# Patient Record
Sex: Female | Born: 1950 | Race: White | Hispanic: No | Marital: Married | Smoking: Never smoker
Health system: Southern US, Community
[De-identification: ages and names within clinical notes are randomized; demographics above are authoritative.]

## PROBLEM LIST (undated history)

## (undated) DIAGNOSIS — N200 Calculus of kidney: Secondary | ICD-10-CM

## (undated) DIAGNOSIS — D0510 Intraductal carcinoma in situ of unspecified breast: Secondary | ICD-10-CM

## (undated) DIAGNOSIS — C801 Malignant (primary) neoplasm, unspecified: Secondary | ICD-10-CM

## (undated) DIAGNOSIS — M810 Age-related osteoporosis without current pathological fracture: Secondary | ICD-10-CM

## (undated) DIAGNOSIS — N950 Postmenopausal bleeding: Secondary | ICD-10-CM

## (undated) DIAGNOSIS — E611 Iron deficiency: Secondary | ICD-10-CM

## (undated) HISTORY — DX: Malignant (primary) neoplasm, unspecified: C80.1

## (undated) HISTORY — DX: Postmenopausal bleeding: N95.0

## (undated) HISTORY — DX: Age-related osteoporosis without current pathological fracture: M81.0

## (undated) HISTORY — DX: Iron deficiency: E61.1

## (undated) HISTORY — DX: Calculus of kidney: N20.0

## (undated) HISTORY — PX: TONSILLECTOMY AND ADENOIDECTOMY: SHX28

## (undated) HISTORY — DX: Intraductal carcinoma in situ of unspecified breast: D05.10

## (undated) HISTORY — PX: BREAST LUMPECTOMY: SHX2

---

## 1985-09-02 DIAGNOSIS — N200 Calculus of kidney: Secondary | ICD-10-CM

## 1985-09-02 HISTORY — DX: Calculus of kidney: N20.0

## 2001-03-20 ENCOUNTER — Encounter: Payer: Self-pay | Admitting: Emergency Medicine

## 2001-03-20 ENCOUNTER — Encounter: Admission: RE | Admit: 2001-03-20 | Discharge: 2001-03-20 | Payer: Self-pay | Admitting: Emergency Medicine

## 2001-04-06 ENCOUNTER — Encounter: Admission: RE | Admit: 2001-04-06 | Discharge: 2001-04-06 | Payer: Self-pay | Admitting: Emergency Medicine

## 2001-04-06 ENCOUNTER — Encounter: Payer: Self-pay | Admitting: Emergency Medicine

## 2002-09-02 DIAGNOSIS — D0510 Intraductal carcinoma in situ of unspecified breast: Secondary | ICD-10-CM

## 2002-09-02 HISTORY — PX: BREAST SURGERY: SHX581

## 2002-09-02 HISTORY — DX: Intraductal carcinoma in situ of unspecified breast: D05.10

## 2002-10-14 ENCOUNTER — Encounter (INDEPENDENT_AMBULATORY_CARE_PROVIDER_SITE_OTHER): Payer: Self-pay | Admitting: *Deleted

## 2002-10-14 ENCOUNTER — Encounter: Admission: RE | Admit: 2002-10-14 | Discharge: 2002-10-14 | Payer: Self-pay | Admitting: General Surgery

## 2002-10-14 ENCOUNTER — Encounter: Payer: Self-pay | Admitting: General Surgery

## 2002-10-21 ENCOUNTER — Ambulatory Visit (HOSPITAL_COMMUNITY): Admission: RE | Admit: 2002-10-21 | Discharge: 2002-10-21 | Payer: Self-pay | Admitting: General Surgery

## 2002-10-21 ENCOUNTER — Encounter: Payer: Self-pay | Admitting: General Surgery

## 2002-10-22 ENCOUNTER — Encounter: Payer: Self-pay | Admitting: General Surgery

## 2002-10-22 ENCOUNTER — Ambulatory Visit (HOSPITAL_COMMUNITY): Admission: RE | Admit: 2002-10-22 | Discharge: 2002-10-22 | Payer: Self-pay | Admitting: General Surgery

## 2002-10-28 ENCOUNTER — Encounter: Payer: Self-pay | Admitting: General Surgery

## 2002-10-28 ENCOUNTER — Ambulatory Visit (HOSPITAL_BASED_OUTPATIENT_CLINIC_OR_DEPARTMENT_OTHER): Admission: RE | Admit: 2002-10-28 | Discharge: 2002-10-28 | Payer: Self-pay | Admitting: General Surgery

## 2002-10-28 ENCOUNTER — Encounter: Admission: RE | Admit: 2002-10-28 | Discharge: 2002-10-28 | Payer: Self-pay | Admitting: General Surgery

## 2002-10-28 ENCOUNTER — Encounter (INDEPENDENT_AMBULATORY_CARE_PROVIDER_SITE_OTHER): Payer: Self-pay | Admitting: Specialist

## 2002-11-22 ENCOUNTER — Ambulatory Visit: Admission: RE | Admit: 2002-11-22 | Discharge: 2003-01-26 | Payer: Self-pay | Admitting: *Deleted

## 2003-04-18 ENCOUNTER — Encounter: Payer: Self-pay | Admitting: General Surgery

## 2003-04-18 ENCOUNTER — Encounter: Admission: RE | Admit: 2003-04-18 | Discharge: 2003-04-18 | Payer: Self-pay | Admitting: General Surgery

## 2003-08-05 ENCOUNTER — Ambulatory Visit (HOSPITAL_COMMUNITY): Admission: RE | Admit: 2003-08-05 | Discharge: 2003-08-05 | Payer: Self-pay | Admitting: Emergency Medicine

## 2003-10-05 ENCOUNTER — Encounter: Admission: RE | Admit: 2003-10-05 | Discharge: 2003-10-05 | Payer: Self-pay | Admitting: General Surgery

## 2004-06-28 ENCOUNTER — Other Ambulatory Visit: Admission: RE | Admit: 2004-06-28 | Discharge: 2004-06-28 | Payer: Self-pay | Admitting: Obstetrics and Gynecology

## 2004-10-05 ENCOUNTER — Encounter: Admission: RE | Admit: 2004-10-05 | Discharge: 2004-10-05 | Payer: Self-pay | Admitting: Obstetrics and Gynecology

## 2005-04-03 ENCOUNTER — Ambulatory Visit: Payer: Self-pay | Admitting: Oncology

## 2005-08-13 ENCOUNTER — Other Ambulatory Visit: Admission: RE | Admit: 2005-08-13 | Discharge: 2005-08-13 | Payer: Self-pay | Admitting: Obstetrics and Gynecology

## 2005-10-07 ENCOUNTER — Encounter: Admission: RE | Admit: 2005-10-07 | Discharge: 2005-10-07 | Payer: Self-pay | Admitting: Obstetrics and Gynecology

## 2005-11-07 ENCOUNTER — Encounter: Admission: RE | Admit: 2005-11-07 | Discharge: 2005-11-07 | Payer: Self-pay | Admitting: General Surgery

## 2006-04-08 ENCOUNTER — Ambulatory Visit: Payer: Self-pay | Admitting: Oncology

## 2006-04-10 LAB — COMPREHENSIVE METABOLIC PANEL
ALT: 24 U/L (ref 0–40)
AST: 20 U/L (ref 0–37)
CO2: 27 mEq/L (ref 19–32)
Creatinine, Ser: 0.92 mg/dL (ref 0.40–1.20)
Sodium: 142 mEq/L (ref 135–145)
Total Bilirubin: 0.4 mg/dL (ref 0.3–1.2)
Total Protein: 6.4 g/dL (ref 6.0–8.3)

## 2006-04-10 LAB — CBC WITH DIFFERENTIAL/PLATELET
BASO%: 0.7 % (ref 0.0–2.0)
EOS%: 2.1 % (ref 0.0–7.0)
HCT: 34.6 % — ABNORMAL LOW (ref 34.8–46.6)
LYMPH%: 35.2 % (ref 14.0–48.0)
MCH: 32 pg (ref 26.0–34.0)
MCHC: 34.1 g/dL (ref 32.0–36.0)
MONO#: 0.4 10*3/uL (ref 0.1–0.9)
NEUT%: 52.5 % (ref 39.6–76.8)
Platelets: 286 10*3/uL (ref 145–400)
RBC: 3.68 10*6/uL — ABNORMAL LOW (ref 3.70–5.32)
WBC: 4.3 10*3/uL (ref 3.9–10.0)
lymph#: 1.5 10*3/uL (ref 0.9–3.3)

## 2006-09-02 HISTORY — PX: HYSTEROSCOPY W/D&C: SHX1775

## 2006-10-08 ENCOUNTER — Encounter: Admission: RE | Admit: 2006-10-08 | Discharge: 2006-10-08 | Payer: Self-pay | Admitting: General Surgery

## 2006-10-20 ENCOUNTER — Encounter: Admission: RE | Admit: 2006-10-20 | Discharge: 2006-10-20 | Payer: Self-pay | Admitting: Emergency Medicine

## 2006-11-28 ENCOUNTER — Encounter: Admission: RE | Admit: 2006-11-28 | Discharge: 2006-11-28 | Payer: Self-pay | Admitting: Emergency Medicine

## 2007-02-24 ENCOUNTER — Encounter: Admission: RE | Admit: 2007-02-24 | Discharge: 2007-02-24 | Payer: Self-pay | Admitting: Family Medicine

## 2007-02-24 ENCOUNTER — Other Ambulatory Visit: Admission: RE | Admit: 2007-02-24 | Discharge: 2007-02-24 | Payer: Self-pay | Admitting: Family Medicine

## 2007-02-24 ENCOUNTER — Encounter (INDEPENDENT_AMBULATORY_CARE_PROVIDER_SITE_OTHER): Payer: Self-pay | Admitting: Family Medicine

## 2007-04-14 ENCOUNTER — Ambulatory Visit: Payer: Self-pay | Admitting: Oncology

## 2007-04-17 LAB — COMPREHENSIVE METABOLIC PANEL
ALT: 15 U/L (ref 0–35)
Alkaline Phosphatase: 68 U/L (ref 39–117)
CO2: 25 mEq/L (ref 19–32)
Creatinine, Ser: 0.82 mg/dL (ref 0.40–1.20)
Sodium: 140 mEq/L (ref 135–145)
Total Bilirubin: 0.4 mg/dL (ref 0.3–1.2)

## 2007-04-17 LAB — CBC WITH DIFFERENTIAL/PLATELET
BASO%: 0.5 % (ref 0.0–2.0)
EOS%: 1.8 % (ref 0.0–7.0)
HCT: 34.4 % — ABNORMAL LOW (ref 34.8–46.6)
LYMPH%: 31.6 % (ref 14.0–48.0)
MCH: 32.2 pg (ref 26.0–34.0)
MCHC: 35 g/dL (ref 32.0–36.0)
MCV: 92.1 fL (ref 81.0–101.0)
MONO%: 8 % (ref 0.0–13.0)
NEUT%: 58.1 % (ref 39.6–76.8)
Platelets: 298 10*3/uL (ref 145–400)
RBC: 3.73 10*6/uL (ref 3.70–5.32)

## 2007-05-14 ENCOUNTER — Ambulatory Visit (HOSPITAL_COMMUNITY): Admission: RE | Admit: 2007-05-14 | Discharge: 2007-05-14 | Payer: Self-pay | Admitting: Obstetrics and Gynecology

## 2007-05-14 ENCOUNTER — Encounter (INDEPENDENT_AMBULATORY_CARE_PROVIDER_SITE_OTHER): Payer: Self-pay | Admitting: Obstetrics and Gynecology

## 2007-09-03 HISTORY — PX: OTHER SURGICAL HISTORY: SHX169

## 2007-10-07 ENCOUNTER — Ambulatory Visit: Payer: Self-pay | Admitting: Oncology

## 2007-10-12 LAB — CBC WITH DIFFERENTIAL/PLATELET
EOS%: 3.1 % (ref 0.0–7.0)
HCT: 35.4 % (ref 34.8–46.6)
HGB: 12 g/dL (ref 11.6–15.9)
LYMPH%: 37.7 % (ref 14.0–48.0)
MCHC: 33.9 g/dL (ref 32.0–36.0)
MCV: 92 fL (ref 81.0–101.0)
MONO#: 0.4 10*3/uL (ref 0.1–0.9)
MONO%: 9.3 % (ref 0.0–13.0)
NEUT%: 49.6 % (ref 39.6–76.8)
Platelets: 253 10*3/uL (ref 145–400)
RBC: 3.85 10*6/uL (ref 3.70–5.32)
RDW: 12.3 % (ref 11.3–14.5)
WBC: 4.3 10*3/uL (ref 3.9–10.0)

## 2007-10-12 LAB — LACTATE DEHYDROGENASE: LDH: 154 U/L (ref 94–250)

## 2007-10-12 LAB — COMPREHENSIVE METABOLIC PANEL
ALT: 16 U/L (ref 0–35)
AST: 20 U/L (ref 0–37)
Albumin: 4.3 g/dL (ref 3.5–5.2)
Sodium: 138 mEq/L (ref 135–145)
Total Protein: 6.7 g/dL (ref 6.0–8.3)

## 2007-10-12 LAB — FSH/LH: FSH: 14.5 m[IU]/mL

## 2007-10-14 ENCOUNTER — Encounter: Admission: RE | Admit: 2007-10-14 | Discharge: 2007-10-14 | Payer: Self-pay | Admitting: Oncology

## 2007-10-16 ENCOUNTER — Encounter: Admission: RE | Admit: 2007-10-16 | Discharge: 2007-10-16 | Payer: Self-pay | Admitting: Oncology

## 2008-10-21 ENCOUNTER — Encounter: Admission: RE | Admit: 2008-10-21 | Discharge: 2008-10-21 | Payer: Self-pay | Admitting: Obstetrics and Gynecology

## 2008-12-06 ENCOUNTER — Ambulatory Visit: Payer: Self-pay | Admitting: Oncology

## 2008-12-08 LAB — CBC WITH DIFFERENTIAL/PLATELET
BASO%: 0.8 % (ref 0.0–2.0)
EOS%: 2.7 % (ref 0.0–7.0)
Eosinophils Absolute: 0.1 10*3/uL (ref 0.0–0.5)
HGB: 12.1 g/dL (ref 11.6–15.9)
LYMPH%: 35.4 % (ref 14.0–49.7)
MONO%: 10.7 % (ref 0.0–14.0)
NEUT#: 2.2 10*3/uL (ref 1.5–6.5)
NEUT%: 50.4 % (ref 38.4–76.8)
RBC: 3.76 10*6/uL (ref 3.70–5.45)
RDW: 13.3 % (ref 11.2–14.5)
WBC: 4.3 10*3/uL (ref 3.9–10.3)

## 2008-12-08 LAB — COMPREHENSIVE METABOLIC PANEL
CO2: 29 mEq/L (ref 19–32)
Chloride: 106 mEq/L (ref 96–112)
Potassium: 3.5 mEq/L (ref 3.5–5.3)

## 2009-10-30 ENCOUNTER — Encounter: Admission: RE | Admit: 2009-10-30 | Discharge: 2009-10-30 | Payer: Self-pay | Admitting: Obstetrics and Gynecology

## 2009-11-09 ENCOUNTER — Encounter: Admission: RE | Admit: 2009-11-09 | Discharge: 2009-11-09 | Payer: Self-pay | Admitting: General Surgery

## 2010-04-18 ENCOUNTER — Encounter: Admission: RE | Admit: 2010-04-18 | Discharge: 2010-04-18 | Payer: Self-pay | Admitting: Geriatric Medicine

## 2010-09-23 ENCOUNTER — Encounter: Payer: Self-pay | Admitting: Family Medicine

## 2010-11-01 ENCOUNTER — Other Ambulatory Visit: Payer: Self-pay | Admitting: Obstetrics and Gynecology

## 2010-11-01 DIAGNOSIS — Z9889 Other specified postprocedural states: Secondary | ICD-10-CM

## 2010-11-19 ENCOUNTER — Ambulatory Visit
Admission: RE | Admit: 2010-11-19 | Discharge: 2010-11-19 | Disposition: A | Discharge status: Home / Self Care | Payer: BC Managed Care – PPO | Source: Ambulatory Visit | Attending: Obstetrics and Gynecology | Admitting: Obstetrics and Gynecology

## 2010-11-19 DIAGNOSIS — Z9889 Other specified postprocedural states: Secondary | ICD-10-CM

## 2011-01-15 NOTE — Op Note (Signed)
Felicia Malone, ORMISTON             ACCOUNT NO.:  [id number]   MEDICAL RECORD NO.:  [id number]          PATIENT TYPE:  AMB   LOCATION:  SDC                           FACILITY:  [facility]   PHYSICIAN:  Hal Morales, M.D.DATE OF BIRTH:  [birth year]   DATE OF PROCEDURE:  DATE OF DISCHARGE:                               OPERATIVE REPORT   PREOPERATIVE DIAGNOSES:  1. Postmenopausal bleeding.  2. Question of endometrial mass on sonohysterogram.   POSTOPERATIVE DIAGNOSIS:  Postmenopausal bleeding.   PROCEDURE:  Hysteroscopy, dilatation and curettage.   SURGEON:  Hal Morales, M.D.   ANESTHESIA:  General orotracheal.   ESTIMATED BLOOD LOSS:  Less than 10 mL.   COMPLICATIONS:  None.   FINDINGS:  The endometrial cavity was normally-shaped with shaggy areas  of endometrium, but for the most part the endometrium was atrophic.  There were no specific lesions noted at the time of hysteroscopy.  A  small amount of curettings was obtained at the time of curettage.   PROCEDURE:  The patient was taken to the operating room after  appropriate identification and placed on the operating table.  After the  attainment of adequate general anesthesia, she was placed in low  lithotomy position.  The perineum and vagina were prepped with multiple  layers of Betadine and draped as a sterile field.  A Foley catheter was  used to empty the bladder.  A Graves speculum was placed in the vagina  and a single-tooth tenaculum placed on the anterior cervix.  The uterus  was sounded to 8 cm.  The cervix was dilated to accommodate the  diagnostic hysteroscope and that hysteroscope used to note and document  the above-noted findings.  The hysteroscope was removed and curettage  undertaken.  The hysteroscope was reinserted showing that most of the  shaggy endometrium had been removed and again that there were no  endometrial lesions.  All instruments were then removed from the vagina  and the patient awakened  from general anesthesia and taken to the  recovery room in satisfactory condition, having tolerated the procedure  well with sponge and instrument counts correct.   SPECIMENS TO PATHOLOGY:  Endometrial curettings.   DISCHARGE INSTRUCTIONS:  Printed instructions for D&C from the [facility].   DISCHARGE MEDICATIONS:  Ibuprofen 600 mg p.o. q.6h. for 24 hours, then  p.r.n..  The patient will continue her preadmission medications, which  include tamoxifen 20 mg a day and Fosamax 70 mg weekly.      Hal Morales, M.D.  Electronically Signed     VPH/MEDQ  D:  05/14/2007  T:  05/14/2007  Job:  [id number]

## 2011-01-15 NOTE — H&P (Signed)
NAMEAUSTEN, Felicia Malone             ACCOUNT NO.:  [id number]   MEDICAL RECORD NO.:  [id number]          PATIENT TYPE:  AMB   LOCATION:  SDC                           FACILITY:  [facility]   PHYSICIAN:  Hal Morales, M.D.DATE OF BIRTH:  1951-02-05   DATE OF ADMISSION:  05/14/2007  DATE OF DISCHARGE:                              HISTORY & PHYSICAL   HISTORY OF PRESENT ILLNESS:  Patient is a 60 year old married white  female, para 0-0-1-0, who presents for evaluation of postmenopausal  bleeding.  The patient had her last menstrual period in July, 2007 until  she had an episode of vaginal bleeding on April 14, 2007 lasting  approximately three days with light-to-moderate flow and no cramping.  She has had no previous bleeding over the previous year.  She denied any  abdominal pain.   Workup of her postmenopausal bleeding included an ultrasound and  sonohystogram with the ultrasound showing a posterior lower uterine  segment subserosal fibroid measuring 2.7 x 2.8 cm.  The right and left  ovaries were normal.  The endometrial thickness was 0.34 cm.  At the  time of sonohystogram, there appeared to be a mass within the  endometrial cavity measuring 0.84 x 0.68 cm.  The uterus was noted to be  retroflexed.  Based on this finding, the recommendation has been made  for hysteroscopy D&C and resection of a mass.   PAST MEDICAL HISTORY:  1. GYN history:  The patient underwent menarche at age 34 and had      menses every 6-8 weeks until 2007.  She had not previously had the      diagnosis of fibroids made.  2. Obstetrical history:  Patient had one pregnancy termination many      years ago without complication.  3. 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  She      started tamoxifen in May, 2005 and continues that through today.      She also has a history of a kidney stones in 1987 and a history of  anemia in the past.  4. Surgical history:  Tonsillectomy and lumpectomy.   FAMILY HISTORY:  Positive for heart disease with a brother who had a  myocardial infarction at age 51 and a father who died in his 44s of  heart disease.  She also has a family history of hypertension.   CURRENT MEDICATIONS:  Tamoxifen, calcium with vitamin D.   HABITS:  Patient is married and is now retired.  She does not smoke  cigarettes.  She drinks 3-5 glasses of wine per week.  She exercises  five times per week.   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  She has had some improvement in her pain,  but  this started to improve prior to starting the Fosamax.   PHYSICAL EXAMINATION:  The patient is a well-developed white female in  no acute distress.  Temperature 97.5, blood pressure 100/70, weight 136 pounds.  LUNGS:  Clear.  HEART:  Regular rate and rhythm.  ABDOMEN:  Soft without masses or organomegaly.  PELVIC:  EG/BUS within normal limits.  Vagina is atrophic.  Cervix is  atrophic with endocervical contact bleeding.  The uterus is normal in  size, shape, consistency, anterior, mobile, and nontender.  Adnexa:  No  masses.   IMPRESSION:  A single episode of postmenopausal bleeding in a patient on  tamoxifen therapy with suggestion of an endometrial mass measuring less  than 1 cm.   DISPOSITION:  A discussion is held with the patient concerning the  recommendation for hysteroscopy/D&C.  She seems to understand, has had  her questions answered, has discussed the risks and benefits with me,  which include but are not limited to anesthesia, bleeding, infection,  damage to adjacent organs, and the risk of uterine perforation.  She  wishes  to proceed with hysteroscopy, D&C and resection of endometrial  mass at [facility] on May 14, 2007.      Hal Morales, M.D.  Electronically Signed     VPH/MEDQ  D:  05/13/2007  T:  05/13/2007  Job:  [id number]

## 2011-01-18 NOTE — Op Note (Signed)
   NAME:  Felicia Malone, Felicia Malone                       ACCOUNT NO.:  [id number]   MEDICAL RECORD NO.:  [id number]                   PATIENT TYPE:  AMB   LOCATION:  DSC                                  FACILITY:  [facility]   PHYSICIAN:  Rose Phi. Maple Hudson, M.D.                DATE OF BIRTH:  [birth year]   DATE OF PROCEDURE:  10/28/2002  DATE OF DISCHARGE:                                 OPERATIVE REPORT   PREOPERATIVE DIAGNOSIS:  Ductal carcinoma in situ, left breast.   POSTOPERATIVE DIAGNOSIS:  Ductal carcinoma in situ, left breast.   PROCEDURE:  Left partial mastectomy with needle localization and specimen  mammography.   SURGEON:  Rose Phi. Maple Hudson, M.D.   ANESTHESIA:  General.   DESCRIPTION OF PROCEDURE:  After suitable general anesthesia was induced,  the patient was placed in the supine position with the left breast prepped  and draped in the usual fashion.  The wire entry point was at the 3 o'clock  position of the left breast and a curved incision using the wire as a guide  was made, and then a wide excision of the wire and surrounding tissue was  carried out.  In the more medial portion of the 9 o'clock, I thought I was a  little close to the wire so a second specimen was excised from there and  also submitted as part of the specimen.  Then there was a little nodular  tissue at the 6 o'clock position and I excised that, which will go also as a  separate specimen, so the larger specimen is the wire and needle  localization, followed by a second specimen of tissue coming from the 3  o'clock position and then a third specimen from the 6 o'clock position.  Specimen mammography confirmed the removal of the area.  Hemostasis obtained  with the cautery.  Subcuticular closure of 4-0 Monocryl and Steri-Strips  carried out.  Dressing applied.  The patient transferred to the recovery  room in satisfactory condition, having tolerated the procedure well.      Rose Phi. Maple Hudson, M.D.    PRY/MEDQ  D:  10/28/2002  T:  10/28/2002  Job:  [id number]

## 2011-02-21 ENCOUNTER — Other Ambulatory Visit: Payer: Self-pay | Admitting: Dermatology

## 2011-06-14 LAB — CBC
HCT: 35.1 — ABNORMAL LOW
MCHC: 34.8
MCV: 93.4
Platelets: 237
WBC: 5.3

## 2011-10-29 ENCOUNTER — Other Ambulatory Visit (INDEPENDENT_AMBULATORY_CARE_PROVIDER_SITE_OTHER): Payer: Self-pay | Admitting: General Surgery

## 2011-10-29 DIAGNOSIS — Z853 Personal history of malignant neoplasm of breast: Secondary | ICD-10-CM

## 2011-11-28 ENCOUNTER — Ambulatory Visit (INDEPENDENT_AMBULATORY_CARE_PROVIDER_SITE_OTHER): Payer: Self-pay | Admitting: General Surgery

## 2011-11-28 ENCOUNTER — Ambulatory Visit
Admission: RE | Admit: 2011-11-28 | Discharge: 2011-11-28 | Disposition: A | Discharge status: Home / Self Care | Payer: BC Managed Care – PPO | Source: Ambulatory Visit | Attending: General Surgery | Admitting: General Surgery

## 2011-11-28 DIAGNOSIS — Z853 Personal history of malignant neoplasm of breast: Secondary | ICD-10-CM

## 2011-12-10 ENCOUNTER — Telehealth: Payer: Self-pay | Admitting: Obstetrics and Gynecology

## 2011-12-11 NOTE — Telephone Encounter (Signed)
PC TO PT PER MESSAGE,"QUESTIONS RGDG MEDS PRESCRIBED". LM ON VM FOR PT TO CB.

## 2011-12-11 NOTE — Telephone Encounter (Signed)
PC FROM PT. PT WANTS TO KNOW IF VPH AGREED WITH RECS OF PROLIA PER RECS FROM [place]. INFORMED SPINE SERIES TO R/O FRACTURES IS REC PER VPH(SEE 08/16/11 OV) AND WILL THEN FU. PT AGREES TO HAVE XRAYS DONE. WILL FU WITH PT AFTER RESULTS REVIEWED BY VPH.

## 2011-12-16 ENCOUNTER — Ambulatory Visit
Admission: RE | Admit: 2011-12-16 | Discharge: 2011-12-16 | Disposition: A | Discharge status: Home / Self Care | Payer: BC Managed Care – PPO | Source: Ambulatory Visit | Attending: Obstetrics and Gynecology | Admitting: Obstetrics and Gynecology

## 2011-12-25 ENCOUNTER — Telehealth: Payer: Self-pay | Admitting: Obstetrics and Gynecology

## 2011-12-25 NOTE — Telephone Encounter (Signed)
TC TO PT PER TELEPHONE CALL.  TOLD PT THORACIC AND LUMBAR SPINE XRAY RECEIVED AND WILL HAVE VPH REVIEW. WILL CB WITH RECS. PT AGREES.

## 2011-12-25 NOTE — Telephone Encounter (Signed)
PC TO PT. TOLD PT THORACIC SPINE XRAY-WNL. LUMBAR SPINE XRAY-INDETERMINATE COMPRESSION FRACTURE. REC FOR PT TO START MEDS. PT STATES," PREV DISCUSSED WITH DR. PAUL(ORTHO) TO START PROLIA". PT AGREES.

## 2012-01-06 ENCOUNTER — Encounter (INDEPENDENT_AMBULATORY_CARE_PROVIDER_SITE_OTHER): Payer: Self-pay | Admitting: General Surgery

## 2012-01-08 ENCOUNTER — Ambulatory Visit (INDEPENDENT_AMBULATORY_CARE_PROVIDER_SITE_OTHER): Payer: BC Managed Care – PPO | Admitting: General Surgery

## 2012-01-08 ENCOUNTER — Encounter (INDEPENDENT_AMBULATORY_CARE_PROVIDER_SITE_OTHER): Payer: Self-pay | Admitting: General Surgery

## 2012-01-08 VITALS — BP 120/81 | HR 80 | Temp 98.6°F | Resp 14 | Ht 62.0 in | Wt 136.6 lb

## 2012-01-08 DIAGNOSIS — D0590 Unspecified type of carcinoma in situ of unspecified breast: Secondary | ICD-10-CM

## 2012-01-08 DIAGNOSIS — D0512 Intraductal carcinoma in situ of left breast: Secondary | ICD-10-CM

## 2012-01-08 NOTE — Progress Notes (Signed)
Subjective:     Patient ID: Felicia Malone, female   DOB: July 18, 1951, 61 y.o.   MRN: [id number]  HPI The patient is a 61 year old white female who is 9 years out from a left breast lumpectomy for DCIS. She finished radiation therapy and 5 years of tamoxifen. Since her last visit her only complaint is of some arthritic foot pain. She denies any breast pain. She denies any discharge from her nipple. Her last mammogram was in March and showed no evidence of malignancy.  Review of Systems  Constitutional: Negative.   HENT: Negative.   Eyes: Negative.   Respiratory: Negative.   Cardiovascular: Negative.   Gastrointestinal: Negative.   Genitourinary: Negative.   Musculoskeletal: Positive for arthralgias.  Skin: Negative.   Neurological: Negative.   Hematological: Negative.   Psychiatric/Behavioral: Negative.        Objective:   Physical Exam  Constitutional: She is oriented to person, place, and time. She appears well-developed and well-nourished.  HENT:  Head: Normocephalic and atraumatic.  Eyes: Conjunctivae and EOM are normal. Pupils are equal, round, and reactive to light.  Neck: Normal range of motion. Neck supple.  Cardiovascular: Normal rate, regular rhythm and normal heart sounds.   Pulmonary/Chest: Effort normal and breath sounds normal.       She has some mild fullness beneath the scar in the lateral left breast secondary to radiation. Otherwise there is no palpable mass in either breast. No palpable axillary supraclavicular or cervical lymphadenopathy  Abdominal: Soft. Bowel sounds are normal. She exhibits no mass. There is no tenderness.  Musculoskeletal: Normal range of motion.  Lymphadenopathy:    She has no cervical adenopathy.  Neurological: She is alert and oriented to person, place, and time.  Skin: Skin is warm and dry.  Psychiatric: She has a normal mood and affect. Her behavior is normal.       Assessment:     9 years status post left breast lumpectomy for  DCIS    Plan:     At this point I have encouraged her to continue to do regular self exams. We will plan to see her back in one year.

## 2012-01-08 NOTE — Patient Instructions (Signed)
Continue regular self exams  

## 2012-07-01 ENCOUNTER — Encounter: Payer: Self-pay | Admitting: Obstetrics and Gynecology

## 2012-07-01 ENCOUNTER — Ambulatory Visit (INDEPENDENT_AMBULATORY_CARE_PROVIDER_SITE_OTHER): Payer: BC Managed Care – PPO | Admitting: Obstetrics and Gynecology

## 2012-07-01 VITALS — BP 100/76 | Ht 62.0 in | Wt 136.0 lb

## 2012-07-01 DIAGNOSIS — M810 Age-related osteoporosis without current pathological fracture: Secondary | ICD-10-CM

## 2012-07-01 DIAGNOSIS — N200 Calculus of kidney: Secondary | ICD-10-CM | POA: Insufficient documentation

## 2012-07-01 DIAGNOSIS — D0510 Intraductal carcinoma in situ of unspecified breast: Secondary | ICD-10-CM | POA: Insufficient documentation

## 2012-07-01 NOTE — Progress Notes (Signed)
Annual:  Last Pap: 03/2010 due 2014 per notes  WNL: Yes Regular Periods:no Contraception: VAS  Monthly Breast exam:yes Tetanus<40yrs:yes Nl.Bladder Function:yes "pt states sometimes she does not feel like she empties her bladder" Daily BMs:yes Healthy Diet:yes Calcium:yes Mammogram:yes Date of Mammogram: 11/19/2011 "WNL" Exercise:yes Have often Exercise: Light Daily  Seatbelt: yes Abuse at home: no Stressful work:no "Not working" Sigmoid-colonoscopy: 2009 "WNL" per Dr. Homero Fellers due 2019 Bone Density: Yes 08/2011 Dr.Paul PCP: Wilhemina Cash, MD Change in PMH: No Changes Change in FMH:No Changes  Subjective:    Felicia Malone is a 61 y.o. female No obstetric history on file. who presents for annual exam.  Vaginal dryness painful intercourse continue.  Has only used Replens and otc lubricants.  The following portions of the patient's history were reviewed and updated as appropriate: allergies, current medications, past family history, past medical history, past social history, past surgical history and problem list.  Review of Systems Pertinent items are noted in HPI. Gastrointestinal:No change in bowel habits, no abdominal pain, no rectal bleeding Genitourinary:negative for dysuria, frequency, hematuria, nocturia and urinary incontinence    Objective:     BP 100/76  Ht 5\' 2"  (1.575 m)  Wt 136 lb (61.689 kg)  BMI 24.87 kg/m2  Weight:  Wt Readings from Last 1 Encounters:  07/01/12 136 lb (61.689 kg)     BMI: Body mass index is 24.87 kg/(m^2). General Appearance: Alert, appropriate appearance for age. No acute distress HEENT: Grossly normal Neck / Thyroid: Supple, no masses, nodes or enlargement Lungs: clear to auscultation bilaterally Back: No CVA tenderness Breast Exam: No masses or nodes.No dimpling, nipple retraction or discharge.Well healed incision Cardiovascular: Regular rate and rhythm. S1, S2, no murmur Gastrointestinal: Soft, non-tender, no masses or  organomegaly Pelvic Exam: External genitalia: normal general appearance Vaginal: atrophic mucosa Cervix: normal appearance Adnexa: non palpable Uterus: normal single, nontender Rectovaginal: normal rectal, no masses Lymphatic Exam: Non-palpable nodes in neck, clavicular, axillary, or inguinal regions Skin: no rash or abnormalities Neurologic: Normal gait and speech, no tremor  Psychiatric: Alert and oriented, appropriate affect.    Urinalysis:Not done    Assessment:    Menopause Atrophic vaginitis  Dyspareunia Osteoporosis Hx Breast cancer, now NED   Plan:   mammogram pap smear due 2014 counseled on menopause with contraindication to HRT, and dyspareunia.  EVOO for lubrication recommended return annually or prn Next Prolia due 08/2012 Follow-up DXA due 2014    MD

## 2012-10-17 ENCOUNTER — Other Ambulatory Visit: Payer: Self-pay

## 2012-11-18 ENCOUNTER — Other Ambulatory Visit: Payer: Self-pay

## 2012-12-10 ENCOUNTER — Ambulatory Visit
Admission: RE | Admit: 2012-12-10 | Discharge: 2012-12-10 | Disposition: A | Discharge status: Home / Self Care | Payer: BC Managed Care – PPO | Source: Ambulatory Visit

## 2012-12-10 DIAGNOSIS — Z9889 Other specified postprocedural states: Secondary | ICD-10-CM

## 2012-12-10 DIAGNOSIS — Z1231 Encounter for screening mammogram for malignant neoplasm of breast: Secondary | ICD-10-CM

## 2012-12-14 ENCOUNTER — Other Ambulatory Visit: Payer: Self-pay | Admitting: Obstetrics and Gynecology

## 2012-12-14 DIAGNOSIS — R928 Other abnormal and inconclusive findings on diagnostic imaging of breast: Secondary | ICD-10-CM

## 2012-12-17 ENCOUNTER — Ambulatory Visit: Payer: BC Managed Care – PPO

## 2012-12-25 ENCOUNTER — Ambulatory Visit
Admission: RE | Admit: 2012-12-25 | Discharge: 2012-12-25 | Disposition: A | Discharge status: Home / Self Care | Payer: BC Managed Care – PPO | Source: Ambulatory Visit | Attending: Obstetrics and Gynecology | Admitting: Obstetrics and Gynecology

## 2012-12-25 DIAGNOSIS — R928 Other abnormal and inconclusive findings on diagnostic imaging of breast: Secondary | ICD-10-CM

## 2013-01-11 ENCOUNTER — Encounter (INDEPENDENT_AMBULATORY_CARE_PROVIDER_SITE_OTHER): Payer: Self-pay | Admitting: General Surgery

## 2013-01-11 ENCOUNTER — Ambulatory Visit (INDEPENDENT_AMBULATORY_CARE_PROVIDER_SITE_OTHER): Payer: BC Managed Care – PPO | Admitting: General Surgery

## 2013-01-11 VITALS — BP 119/72 | HR 70 | Temp 98.6°F | Resp 12 | Ht 62.0 in | Wt 136.4 lb

## 2013-01-11 DIAGNOSIS — D0590 Unspecified type of carcinoma in situ of unspecified breast: Secondary | ICD-10-CM

## 2013-01-11 DIAGNOSIS — D0512 Intraductal carcinoma in situ of left breast: Secondary | ICD-10-CM

## 2013-01-11 NOTE — Patient Instructions (Signed)
Continue regular self exams  

## 2013-01-11 NOTE — Progress Notes (Signed)
Subjective:     Patient ID: Felicia Malone, female   DOB: 06/09/1951, 62 y.o.   MRN: [id number]  HPI The patient is a 62 year old white female who is 10 years status post left breast lumpectomy for DCIS. She has been doing very well from a medical standpoint during the last year. She has no complaints today. She denies any breast pain or discharge from her nipple.  Review of Systems  Constitutional: Negative.   HENT: Negative.   Eyes: Negative.   Respiratory: Negative.   Cardiovascular: Negative.   Gastrointestinal: Negative.   Endocrine: Negative.   Genitourinary: Negative.   Musculoskeletal: Negative.   Skin: Negative.   Allergic/Immunologic: Negative.   Neurological: Negative.   Hematological: Negative.   Psychiatric/Behavioral: Negative.        Objective:   Physical Exam  Constitutional: She is oriented to person, place, and time. She appears well-developed and well-nourished.  HENT:  Head: Normocephalic and atraumatic.  Eyes: Conjunctivae and EOM are normal. Pupils are equal, round, and reactive to light.  Neck: Normal range of motion. Neck supple.  Cardiovascular: Normal rate, regular rhythm and normal heart sounds.   Pulmonary/Chest: Effort normal and breath sounds normal.  There is no palpable mass in either breast. There is no palpable axillary or supraclavicular cervical lymphadenopathy.  Abdominal: Soft. Bowel sounds are normal. She exhibits no mass. There is no tenderness.  Musculoskeletal: Normal range of motion.  Lymphadenopathy:    She has no cervical adenopathy.  Neurological: She is alert and oriented to person, place, and time.  Skin: Skin is warm and dry.  Psychiatric: She has a normal mood and affect. Her behavior is normal.       Assessment:     The patient is 10 years status post left breast lumpectomy for DCIS     Plan:     At this point she will continue to do regular self exams. We will plan to see her back in one year.

## 2013-07-08 ENCOUNTER — Other Ambulatory Visit: Payer: Self-pay

## 2013-12-03 ENCOUNTER — Other Ambulatory Visit: Payer: Self-pay

## 2013-12-03 DIAGNOSIS — Z1231 Encounter for screening mammogram for malignant neoplasm of breast: Secondary | ICD-10-CM

## 2013-12-03 DIAGNOSIS — Z9889 Other specified postprocedural states: Secondary | ICD-10-CM

## 2013-12-03 DIAGNOSIS — Z853 Personal history of malignant neoplasm of breast: Secondary | ICD-10-CM

## 2013-12-23 ENCOUNTER — Ambulatory Visit
Admission: RE | Admit: 2013-12-23 | Discharge: 2013-12-23 | Disposition: A | Discharge status: Home / Self Care | Payer: BC Managed Care – PPO | Source: Ambulatory Visit

## 2013-12-23 DIAGNOSIS — Z1231 Encounter for screening mammogram for malignant neoplasm of breast: Secondary | ICD-10-CM

## 2013-12-23 DIAGNOSIS — Z9889 Other specified postprocedural states: Secondary | ICD-10-CM

## 2013-12-23 DIAGNOSIS — Z853 Personal history of malignant neoplasm of breast: Secondary | ICD-10-CM

## 2014-01-31 ENCOUNTER — Ambulatory Visit (INDEPENDENT_AMBULATORY_CARE_PROVIDER_SITE_OTHER): Payer: BC Managed Care – PPO | Admitting: General Surgery

## 2014-01-31 ENCOUNTER — Encounter (INDEPENDENT_AMBULATORY_CARE_PROVIDER_SITE_OTHER): Payer: Self-pay | Admitting: General Surgery

## 2014-01-31 VITALS — BP 126/80 | HR 63 | Temp 98.3°F | Ht 62.0 in | Wt 136.0 lb

## 2014-01-31 DIAGNOSIS — D0590 Unspecified type of carcinoma in situ of unspecified breast: Secondary | ICD-10-CM

## 2014-01-31 DIAGNOSIS — D0510 Intraductal carcinoma in situ of unspecified breast: Secondary | ICD-10-CM

## 2014-01-31 NOTE — Progress Notes (Signed)
Subjective:     Patient ID: Felicia Malone, female   DOB: 05-26-1951, 63 y.o.   MRN: [id number]  HPI The patient is a 63 year old white female who is 11 years status post left breast lumpectomy for DCIS. She has been well and without any major medical illnesses for the last year. She denies any breast pain. She denies any discharge from her nipple. She recently had a mammogram that showed no evidence of malignancy.  Review of Systems  Constitutional: Negative.   HENT: Negative.   Eyes: Negative.   Respiratory: Negative.   Cardiovascular: Negative.   Gastrointestinal: Negative.   Endocrine: Negative.   Genitourinary: Negative.   Musculoskeletal: Negative.   Skin: Negative.   Allergic/Immunologic: Negative.   Neurological: Negative.   Hematological: Negative.   Psychiatric/Behavioral: Negative.        Objective:   Physical Exam  Constitutional: She is oriented to person, place, and time. She appears well-developed and well-nourished.  HENT:  Head: Normocephalic and atraumatic.  Eyes: Conjunctivae and EOM are normal. Pupils are equal, round, and reactive to light.  Neck: Normal range of motion. Neck supple.  Cardiovascular: Normal rate, regular rhythm and normal heart sounds.   Pulmonary/Chest: Effort normal and breath sounds normal.  She has symmetric dense breast tissue bilaterally but no discrete palpable mass in either breast. There is no palpable axillary, supraclavicular, or cervical lymphadenopathy.  Abdominal: Soft. Bowel sounds are normal.  Musculoskeletal: Normal range of motion.  Lymphadenopathy:    She has no cervical adenopathy.  Neurological: She is alert and oriented to person, place, and time.  Skin: Skin is warm and dry.  Psychiatric: She has a normal mood and affect. Her behavior is normal.       Assessment:     The patient is 11 years status post left breast lumpectomy for DCIS     Plan:     At this point she will continue to do regular self exams. I  will plan to see her back in one year.

## 2014-01-31 NOTE — Patient Instructions (Signed)
Continue regular self exams  

## 2014-11-30 ENCOUNTER — Other Ambulatory Visit: Payer: Self-pay

## 2014-11-30 DIAGNOSIS — Z853 Personal history of malignant neoplasm of breast: Secondary | ICD-10-CM

## 2014-11-30 DIAGNOSIS — Z1231 Encounter for screening mammogram for malignant neoplasm of breast: Secondary | ICD-10-CM

## 2014-11-30 DIAGNOSIS — Z9889 Other specified postprocedural states: Secondary | ICD-10-CM

## 2014-12-26 ENCOUNTER — Ambulatory Visit
Admission: RE | Admit: 2014-12-26 | Discharge: 2014-12-26 | Disposition: A | Discharge status: Home / Self Care | Payer: BC Managed Care – PPO | Source: Ambulatory Visit | Attending: Internal Medicine | Admitting: Internal Medicine

## 2014-12-26 ENCOUNTER — Other Ambulatory Visit: Payer: Self-pay | Admitting: Internal Medicine

## 2014-12-26 DIAGNOSIS — R1030 Lower abdominal pain, unspecified: Secondary | ICD-10-CM

## 2014-12-27 ENCOUNTER — Other Ambulatory Visit: Payer: Self-pay | Admitting: Internal Medicine

## 2014-12-27 ENCOUNTER — Ambulatory Visit
Admission: RE | Admit: 2014-12-27 | Discharge: 2014-12-27 | Disposition: A | Discharge status: Home / Self Care | Payer: BC Managed Care – PPO | Source: Ambulatory Visit | Attending: Internal Medicine | Admitting: Internal Medicine

## 2014-12-27 DIAGNOSIS — R1030 Lower abdominal pain, unspecified: Secondary | ICD-10-CM

## 2014-12-27 MED ORDER — IOHEXOL 300 MG/ML  SOLN
40.0000 mL | Freq: Once | INTRAMUSCULAR | Status: AC | PRN
  Administered 2014-12-27: 40 mL via ORAL

## 2014-12-27 MED ORDER — IOHEXOL 300 MG/ML  SOLN
100.0000 mL | Freq: Once | INTRAMUSCULAR | Status: AC | PRN
Start: 2014-12-27 — End: 2014-12-27
  Administered 2014-12-27: 100 mL via INTRAVENOUS

## 2015-01-02 ENCOUNTER — Ambulatory Visit
Admission: RE | Admit: 2015-01-02 | Discharge: 2015-01-02 | Disposition: A | Discharge status: Home / Self Care | Payer: BC Managed Care – PPO | Source: Ambulatory Visit

## 2015-01-02 DIAGNOSIS — Z9889 Other specified postprocedural states: Secondary | ICD-10-CM

## 2015-01-02 DIAGNOSIS — Z853 Personal history of malignant neoplasm of breast: Secondary | ICD-10-CM

## 2015-01-02 DIAGNOSIS — Z1231 Encounter for screening mammogram for malignant neoplasm of breast: Secondary | ICD-10-CM

## 2015-05-30 ENCOUNTER — Other Ambulatory Visit: Payer: Self-pay | Admitting: Geriatric Medicine

## 2015-05-30 DIAGNOSIS — IMO0002 Reserved for concepts with insufficient information to code with codable children: Secondary | ICD-10-CM

## 2015-06-05 ENCOUNTER — Other Ambulatory Visit: Payer: BC Managed Care – PPO

## 2015-06-07 ENCOUNTER — Ambulatory Visit
Admission: RE | Admit: 2015-06-07 | Discharge: 2015-06-07 | Disposition: A | Discharge status: Home / Self Care | Payer: BC Managed Care – PPO | Source: Ambulatory Visit | Attending: Geriatric Medicine | Admitting: Geriatric Medicine

## 2015-06-07 DIAGNOSIS — IMO0002 Reserved for concepts with insufficient information to code with codable children: Secondary | ICD-10-CM

## 2015-06-07 MED ORDER — IOPAMIDOL (ISOVUE-300) INJECTION 61%
100.0000 mL | Freq: Once | INTRAVENOUS | Status: AC | PRN
  Administered 2015-06-07: 100 mL via INTRAVENOUS

## 2017-08-15 ENCOUNTER — Ambulatory Visit
Admission: RE | Admit: 2017-08-15 | Discharge: 2017-08-15 | Disposition: A | Discharge status: Home / Self Care | Payer: BC Managed Care – PPO | Source: Ambulatory Visit | Attending: Obstetrics and Gynecology | Admitting: Obstetrics and Gynecology

## 2017-08-15 ENCOUNTER — Other Ambulatory Visit: Payer: Self-pay | Admitting: Obstetrics and Gynecology

## 2017-08-15 DIAGNOSIS — M810 Age-related osteoporosis without current pathological fracture: Secondary | ICD-10-CM

## 2018-05-20 IMAGING — CR DG LUMBAR SPINE COMPLETE 4+V
5 series · 5 of 5 positions shown · non-contrast
Comparison: CT 06/07/2015

CLINICAL DATA: Osteoporosis.

EXAM:
LUMBAR SPINE - COMPLETE 4+ VIEW

[t l-spine a.p.]
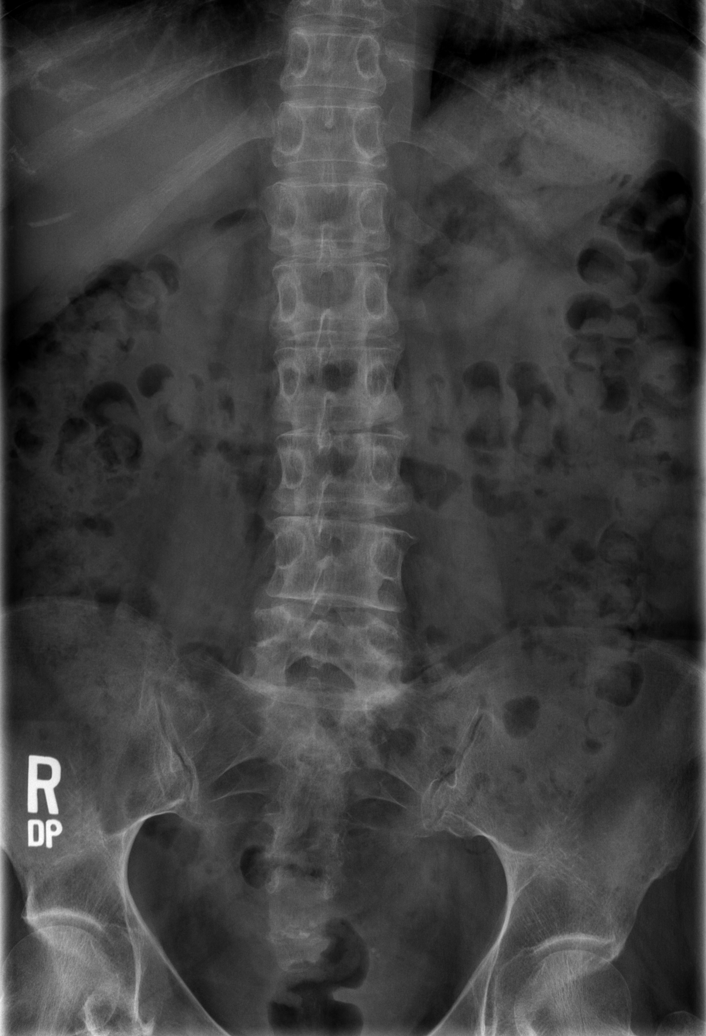

[t l-spine oblique exposure (1 of 2)]
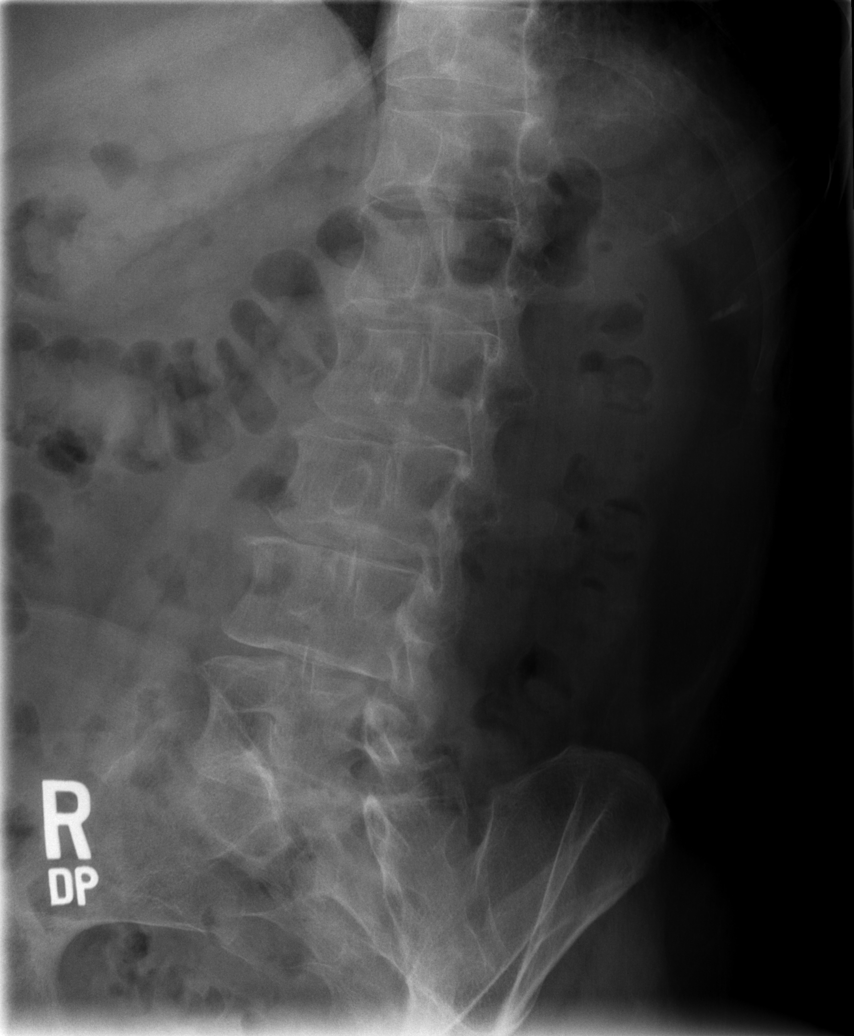

[t l-spine oblique exposure (2 of 2)]
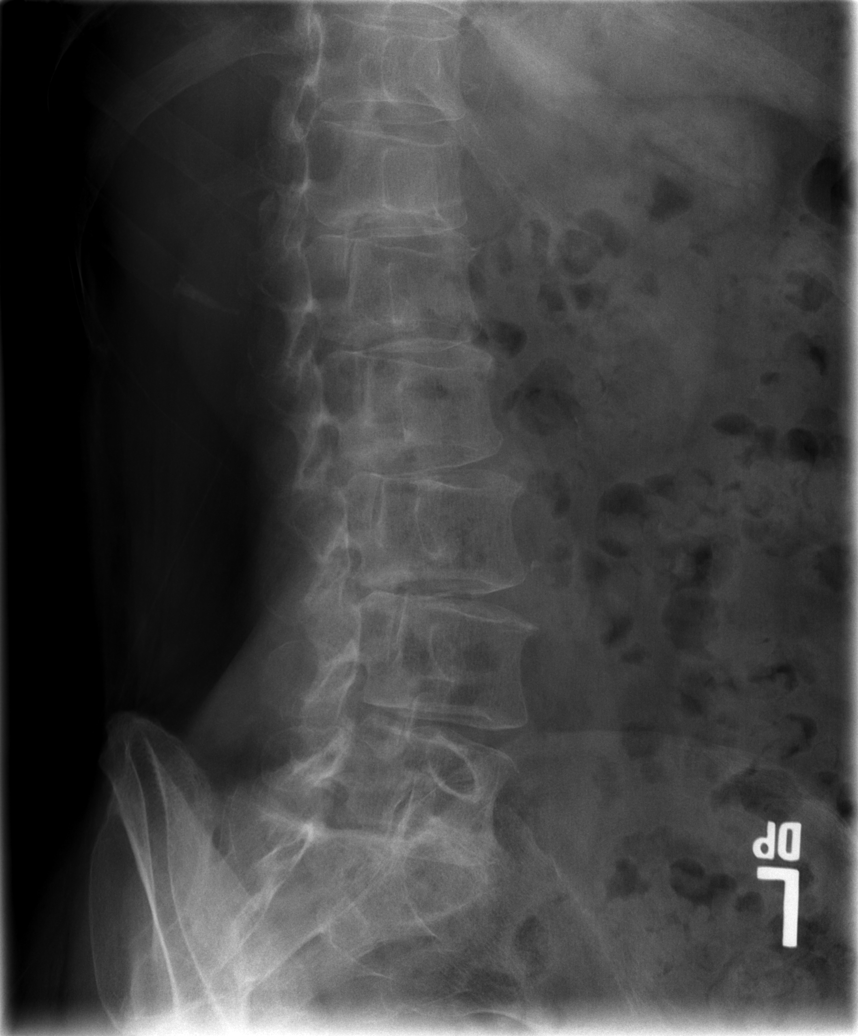

[t l-spine lat]
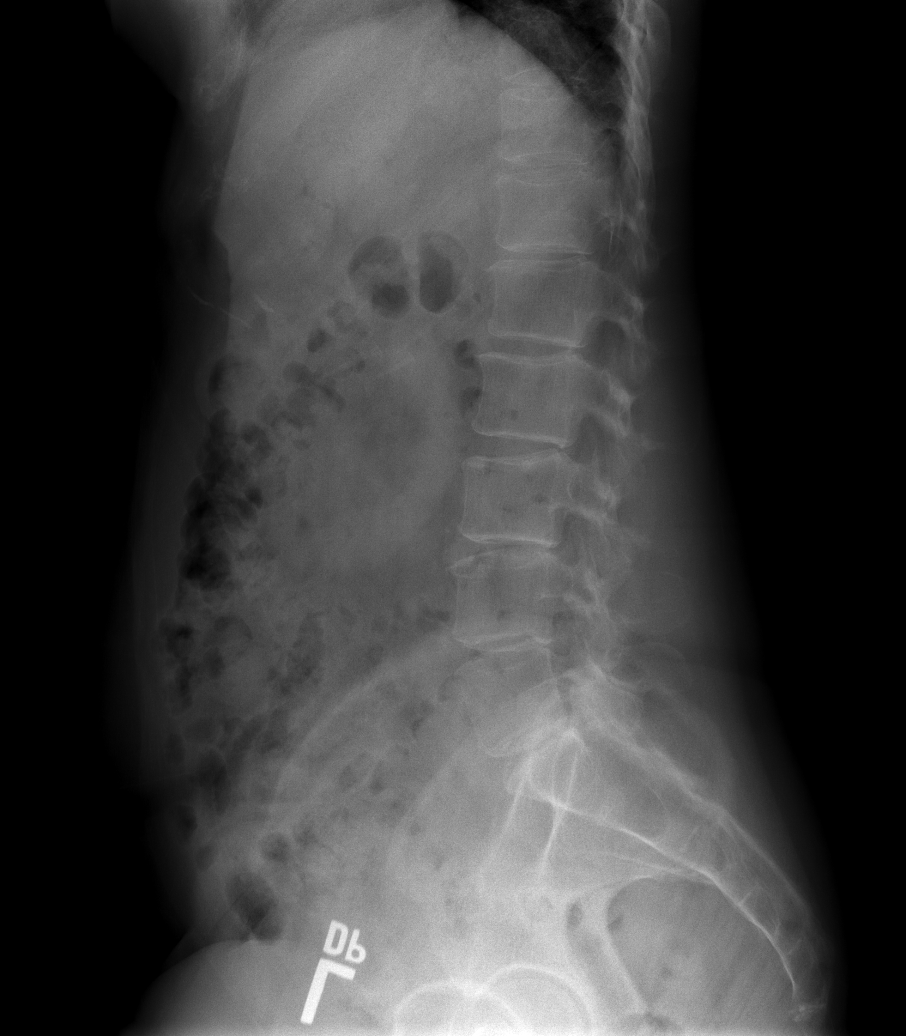

[t l-spine l5-s1 spot]
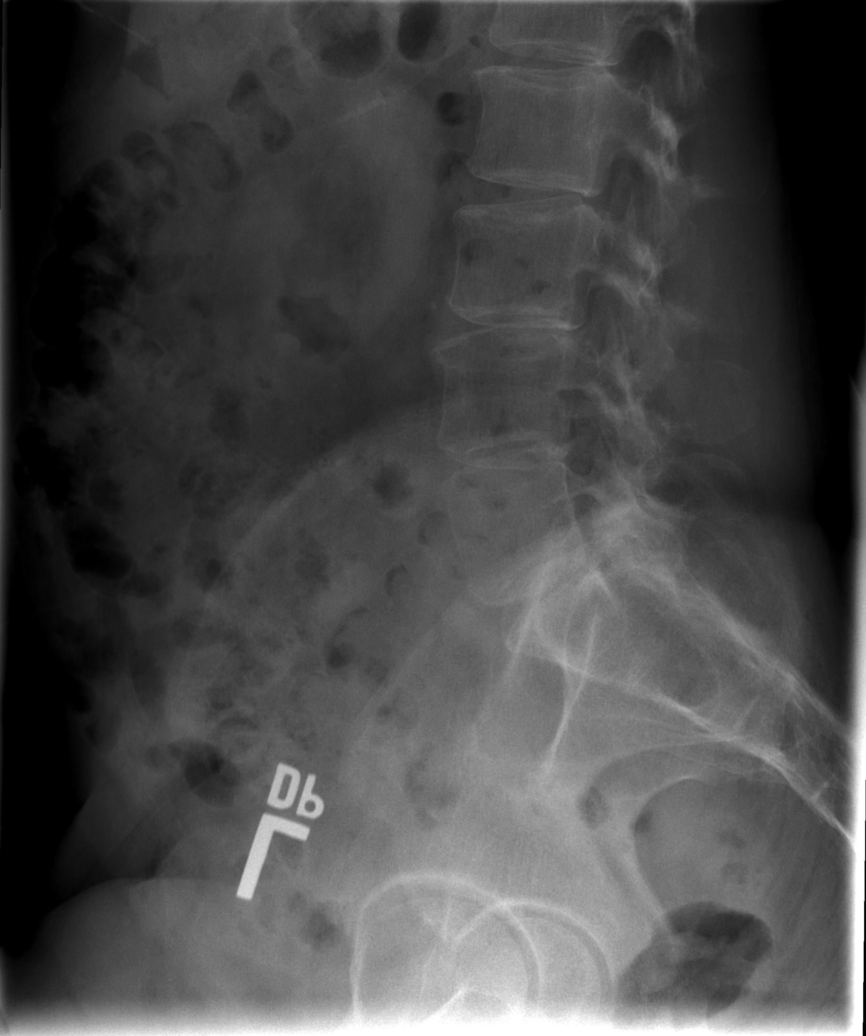

[5 of 5 positions shown; findings below may reference images not displayed]

FINDINGS: Minimal thoracolumbar curvature convex to the right and lower lumbar
curvature convex to the left. Mild disc space narrowing throughout
the lumbar region. No compression fracture. Mild lower lumbar facet
osteoarthritis.
IMPRESSION: No fracture.  Ordinary curvature and degenerative changes.
